# Patient Record
Sex: Male | Born: 2000 | Race: White | Hispanic: No | Marital: Single | Smoking: Never smoker
Health system: Southern US, Community
[De-identification: ages and names within clinical notes are randomized; demographics above are authoritative.]

## PROBLEM LIST (undated history)

## (undated) DIAGNOSIS — J45909 Unspecified asthma, uncomplicated: Secondary | ICD-10-CM

## (undated) HISTORY — PX: TONSILLECTOMY: SUR1361

## (undated) HISTORY — PX: ADENOIDECTOMY: SUR15

---
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
## 2023-11-28 ENCOUNTER — Ambulatory Visit: Payer: Self-pay | Admitting: General Surgery

## 2023-12-01 ENCOUNTER — Encounter (HOSPITAL_BASED_OUTPATIENT_CLINIC_OR_DEPARTMENT_OTHER): Payer: Self-pay | Admitting: General Surgery

## 2023-12-01 ENCOUNTER — Other Ambulatory Visit: Payer: Self-pay

## 2023-12-05 ENCOUNTER — Encounter (HOSPITAL_BASED_OUTPATIENT_CLINIC_OR_DEPARTMENT_OTHER)
Admission: RE | Disposition: A | Discharge status: Home / Self Care | Payer: Self-pay | Source: Home / Self Care | Attending: General Surgery

## 2023-12-05 ENCOUNTER — Encounter (HOSPITAL_BASED_OUTPATIENT_CLINIC_OR_DEPARTMENT_OTHER): Payer: Self-pay | Admitting: General Surgery

## 2023-12-05 ENCOUNTER — Ambulatory Visit (HOSPITAL_BASED_OUTPATIENT_CLINIC_OR_DEPARTMENT_OTHER)
Admission: RE | Admit: 2023-12-05 | Discharge: 2023-12-05 | Disposition: A | Discharge status: Home / Self Care | Payer: MEDICAID | Attending: General Surgery | Admitting: General Surgery

## 2023-12-05 DIAGNOSIS — J45909 Unspecified asthma, uncomplicated: Secondary | ICD-10-CM | POA: Diagnosis not present

## 2023-12-05 DIAGNOSIS — L989 Disorder of the skin and subcutaneous tissue, unspecified: Secondary | ICD-10-CM | POA: Diagnosis present

## 2023-12-05 DIAGNOSIS — L720 Epidermal cyst: Secondary | ICD-10-CM | POA: Insufficient documentation

## 2023-12-05 DIAGNOSIS — Z7951 Long term (current) use of inhaled steroids: Secondary | ICD-10-CM | POA: Insufficient documentation

## 2023-12-05 HISTORY — PX: EXCISION OF BACK LESION: SHX6597

## 2023-12-05 SURGERY — EXCISION, LESION, BACK
Anesthesia: LOCAL

## 2023-12-05 MED ORDER — CHLORHEXIDINE GLUCONATE CLOTH 2 % EX PADS: 6.0000 | MEDICATED_PAD | Freq: Once | CUTANEOUS | Status: DC

## 2023-12-05 MED ORDER — LIDOCAINE HCL 1 % IJ SOLN
INTRAMUSCULAR | Status: DC | PRN
  Administered 2023-12-05: 5 mL

## 2023-12-05 SURGICAL SUPPLY — 41 items
BENZOIN TINCTURE PRP APPL 2/3 (GAUZE/BANDAGES/DRESSINGS) ×1 IMPLANT
BLADE SURG 10 STRL SS (BLADE) ×1 IMPLANT
BLADE SURG 15 STRL LF DISP TIS (BLADE) ×1 IMPLANT
CANISTER SUCT 1200ML W/VALVE (MISCELLANEOUS) IMPLANT
CHLORAPREP W/TINT 26 (MISCELLANEOUS) ×1 IMPLANT
CLEANER CAUTERY TIP PAD (MISCELLANEOUS) ×1 IMPLANT
COVER BACK TABLE 60X90IN (DRAPES) ×1 IMPLANT
COVER MAYO STAND STRL (DRAPES) ×1 IMPLANT
DERMABOND ADVANCED .7 DNX12 (GAUZE/BANDAGES/DRESSINGS) IMPLANT
DRAPE LAPAROTOMY 100X72 PEDS (DRAPES) ×1 IMPLANT
DRAPE UTILITY XL STRL (DRAPES) ×1 IMPLANT
DRSG TEGADERM 4X4.75 (GAUZE/BANDAGES/DRESSINGS) IMPLANT
ELECTRODE REM PT RTRN 9FT ADLT (ELECTROSURGICAL) ×1 IMPLANT
GAUZE 4X4 16PLY ~~LOC~~+RFID DBL (SPONGE) IMPLANT
GAUZE SPONGE 2X2 STRL 8-PLY (GAUZE/BANDAGES/DRESSINGS) IMPLANT
GLOVE BIO SURGEON STRL SZ7.5 (GLOVE) ×1 IMPLANT
GOWN STRL REUS W/ TWL LRG LVL3 (GOWN DISPOSABLE) ×2 IMPLANT
NDL HYPO 25X1 1.5 SAFETY (NEEDLE) IMPLANT
NEEDLE HYPO 25X1 1.5 SAFETY (NEEDLE) IMPLANT
NS IRRIG 1000ML POUR BTL (IV SOLUTION) ×1 IMPLANT
PACK BASIN DAY SURGERY FS (CUSTOM PROCEDURE TRAY) ×1 IMPLANT
PENCIL SMOKE EVACUATOR (MISCELLANEOUS) ×1 IMPLANT
SPIKE FLUID TRANSFER (MISCELLANEOUS) IMPLANT
SPONGE T-LAP 18X18 ~~LOC~~+RFID (SPONGE) ×1 IMPLANT
STRIP CLOSURE SKIN 1/2X4 (GAUZE/BANDAGES/DRESSINGS) ×1 IMPLANT
STRIP CLOSURE SKIN 1/4X4 (GAUZE/BANDAGES/DRESSINGS) IMPLANT
SUT CHROMIC 3 0 SH 27 (SUTURE) IMPLANT
SUT ETHILON 3 0 PS 1 (SUTURE) IMPLANT
SUT MON AB 4-0 PC3 18 (SUTURE) IMPLANT
SUT PROLENE 3 0 PS 2 (SUTURE) IMPLANT
SUT SILK 2 0 PERMA HAND 18 BK (SUTURE) IMPLANT
SUT VIC AB 3-0 54X BRD REEL (SUTURE) IMPLANT
SUT VIC AB 3-0 FS2 27 (SUTURE) IMPLANT
SUT VIC AB 3-0 SH 27X BRD (SUTURE) IMPLANT
SUT VIC AB 4-0 PS2 18 (SUTURE) IMPLANT
SUT VIC AB 4-0 RB1 27X BRD (SUTURE) IMPLANT
SUT VICRYL AB 3 0 TIES (SUTURE) IMPLANT
SYR CONTROL 10ML LL (SYRINGE) IMPLANT
TOWEL GREEN STERILE FF (TOWEL DISPOSABLE) ×2 IMPLANT
TUBE CONNECTING 20X1/4 (TUBING) IMPLANT
YANKAUER SUCT BULB TIP NO VENT (SUCTIONS) IMPLANT

## 2023-12-05 NOTE — H&P (Signed)
 REFERRING PHYSICIAN: Alec House J PROVIDER: DEWARD GARNETTE NULL, MD MRN: [id number] DOB: 2000/07/15 Subjective   Chief Complaint: New Consultation  History of Present Illness: Anthony Mckenzie is a 23 y.o. male who is seen today as an office consultation for evaluation of New Consultation  We are asked to see the patient in consultation by Dr. Marcello Alec to evaluate him for a skin lesion on his back. The patient is a 23 year old white male who has noticed a lesion on his low back for the last couple years. It does cause him discomfort when he pushes his back up against something. He has had no drainage from the area. The size of the area has been stable over the last couple years. He does smoke marijuana and quit vaping about 2 weeks ago  Review of Systems: A complete review of systems was obtained from the patient. I have reviewed this information and discussed as appropriate with the patient. See HPI as well for other ROS.  ROS   Medical History: Past Medical History:  Diagnosis Date  Anxiety  Asthma, unspecified asthma severity, unspecified whether complicated, unspecified whether persistent ([facility]-HCC)   Patient Active Problem List  Diagnosis  Skin lesion of back   History reviewed. No pertinent surgical history.   No Known Allergies  Current Outpatient Medications on File Prior to Visit  Medication Sig Dispense Refill  ADZENYS XR-ODT 6.3 mg TbLB Take 1 tablet by mouth once daily  beclomethasone (QVAR) 40 mcg/actuation inhaler Inhale into the lungs  budesonide (PULMICORT) 0.5 mg/2 mL nebulizer solution Take 2 mLs (0.5 mg total) by nebulization once daily 60 mL 0  dexmethylphenidate (FOCALIN XR) 10 MG XR capsule Take by mouth  escitalopram oxalate (LEXAPRO) 10 MG tablet Take 10 mg by mouth once daily  ibuprofen (MOTRIN) 200 MG tablet Take by mouth  lisdexamfetamine (VYVANSE) 30 MG capsule Take by mouth nightly as needed  PROAIR HFA 90 mcg/actuation inhaler  valACYclovir (VALTREX)  500 MG tablet TAKE 1 CAPLET BY MOUTH TWICE DAILY   No current facility-administered medications on file prior to visit.   Family History  Problem Relation Age of Onset  High blood pressure (Hypertension) Mother    Social History   Tobacco Use  Smoking Status Never  Smokeless Tobacco Never    Social History   Socioeconomic History  Marital status: Single  Tobacco Use  Smoking status: Never  Smokeless tobacco: Never   Objective:   Vitals:  BP: 122/72  Weight: 79.2 kg (174 lb 9.6 oz)  Height: 185.4 cm (6' 1)  PainSc: 0-No pain   Body mass index is 23.04 kg/m.  Physical Exam Constitutional:  General: He is not in acute distress. Appearance: Normal appearance.  HENT:  Head: Normocephalic and atraumatic.  Right Ear: External ear normal.  Left Ear: External ear normal.  Nose: Nose normal.  Mouth/Throat:  Mouth: Mucous membranes are moist.  Pharynx: Oropharynx is clear.  Eyes:  General: No scleral icterus. Extraocular Movements: Extraocular movements intact.  Conjunctiva/sclera: Conjunctivae normal.  Pupils: Pupils are equal, round, and reactive to light.  Cardiovascular:  Rate and Rhythm: Normal rate and regular rhythm.  Pulses: Normal pulses.  Heart sounds: Normal heart sounds.  Pulmonary:  Effort: Pulmonary effort is normal. No respiratory distress.  Breath sounds: Normal breath sounds.  Abdominal:  General: Abdomen is flat. Bowel sounds are normal. There is no distension.  Palpations: Abdomen is soft.  Tenderness: There is no abdominal tenderness.  Musculoskeletal:  General: No swelling or deformity. Normal range  of motion.  Cervical back: Normal range of motion and neck supple. No tenderness.  Skin: General: Skin is warm and dry.  Coloration: Skin is not jaundiced.  Comments: There is a 6 mm raised lesion in the skin of the left low back. The area is mobile and does not appear to be tethered to anything deeper. There is no overlying skin changes and  no punctate openings in the skin  Neurological:  General: No focal deficit present.  Mental Status: He is alert and oriented to person, place, and time.  Psychiatric:  Mood and Affect: Mood normal.  Behavior: Behavior normal.     Labs, Imaging and Diagnostic Testing:  Assessment and Plan:   Diagnoses and all orders for this visit:  Skin lesion of back - [facility] Case Posting Request; Future   The patient appears to have a small lesion in the skin of his left low back which causes him some discomfort when it is pushed on. Because of this he would like to have it removed and I feel like this is a reasonable thing to do. I have discussed with him in detail the risks and benefits of the operation as well as some of the technical aspects and he understands and wishes to proceed. We will move forward surgical scheduling. I feel this could be done with just local anesthetic

## 2023-12-05 NOTE — Op Note (Signed)
 12/05/2023  1:21 PM  PATIENT:  Anthony Mckenzie  22 y.o. male  PRE-OPERATIVE DIAGNOSIS:  SKIN LESION BACK  POST-OPERATIVE DIAGNOSIS:  SKIN LESION BACK  PROCEDURE:  Procedure(s): EXCISION, LESION, BACK (N/A)  SURGEON:  Surgeons and Role:    * Curvin Deward MOULD, MD - Primary  PHYSICIAN ASSISTANT:   ASSISTANTS: none   ANESTHESIA:   local  EBL:  minimal   BLOOD ADMINISTERED:none  DRAINS: none   LOCAL MEDICATIONS USED:  MARCAINE    and LIDOCAINE    SPECIMEN:  Source of Specimen:  skin lesion from back  DISPOSITION OF SPECIMEN:  PATHOLOGY  COUNTS:  YES  TOURNIQUET:  * No tourniquets in log *  DICTATION: .Dragon Dictation  After informed consent was obtained the patient was brought to the operating room and placed in the prone position on the operating table.  The area on his back was then prepped with ChloraPrep, allowed to dry, and draped in usual sterile manner.  An appropriate timeout was performed.  The area around the skin lesion was infiltrated with a combination of 1% lidocaine  and quarter percent Marcaine until a good field block was created.  An elliptical incision was then made around the skin lesion with a 15 blade knife.  The incision was carried through the skin and subcutaneous tissue sharply with a 15 blade knife so that the entire skin lesion was removed.  Once it was removed it was oriented with a short stitch on the superior surface and a long stitch along the left surface.  The lesion was then sent to pathology for further evaluation.  The incision was then closed with multiple interrupted 4-0 Monocryl subcuticular stitches.  Dermabond dressings were applied.  The patient tolerated the procedure well.  At the end of the case all needle sponge and instrument counts were correct.  The patient was then taken to recovery in stable condition.  After excision the excised area measured approximately 2 x 1 cm  PLAN OF CARE: Discharge to home after PACU  PATIENT DISPOSITION:   PACU - hemodynamically stable.   Delay start of Pharmacological VTE agent (>24hrs) due to surgical blood loss or risk of bleeding: not applicable

## 2023-12-06 ENCOUNTER — Encounter (HOSPITAL_BASED_OUTPATIENT_CLINIC_OR_DEPARTMENT_OTHER): Payer: Self-pay | Admitting: General Surgery

## 2023-12-07 LAB — SURGICAL PATHOLOGY

## 2024-01-12 ENCOUNTER — Emergency Department (HOSPITAL_BASED_OUTPATIENT_CLINIC_OR_DEPARTMENT_OTHER)
Admission: EM | Admit: 2024-01-12 | Discharge: 2024-01-12 | Disposition: A | Discharge status: Home / Self Care | Payer: MEDICAID | Attending: Emergency Medicine | Admitting: Emergency Medicine

## 2024-01-12 ENCOUNTER — Emergency Department (HOSPITAL_BASED_OUTPATIENT_CLINIC_OR_DEPARTMENT_OTHER): Payer: MEDICAID

## 2024-01-12 ENCOUNTER — Encounter (HOSPITAL_BASED_OUTPATIENT_CLINIC_OR_DEPARTMENT_OTHER): Payer: Self-pay

## 2024-01-12 ENCOUNTER — Other Ambulatory Visit: Payer: Self-pay

## 2024-01-12 DIAGNOSIS — R519 Headache, unspecified: Secondary | ICD-10-CM | POA: Diagnosis present

## 2024-01-12 LAB — CBC WITH DIFFERENTIAL/PLATELET
Abs Immature Granulocytes: 0.02 K/uL (ref 0.00–0.07)
Basophils Absolute: 0 K/uL (ref 0.0–0.1)
Basophils Relative: 0 %
Eosinophils Absolute: 0 K/uL (ref 0.0–0.5)
Eosinophils Relative: 0 %
HCT: 42.1 % (ref 39.0–52.0)
Hemoglobin: 14.5 g/dL (ref 13.0–17.0)
Immature Granulocytes: 0 %
Lymphocytes Relative: 27 %
Lymphs Abs: 2 K/uL (ref 0.7–4.0)
MCH: 31.5 pg (ref 26.0–34.0)
MCHC: 34.4 g/dL (ref 30.0–36.0)
MCV: 91.5 fL (ref 80.0–100.0)
Monocytes Absolute: 0.6 K/uL (ref 0.1–1.0)
Monocytes Relative: 7 %
Neutro Abs: 5 K/uL (ref 1.7–7.7)
Neutrophils Relative %: 66 %
Platelets: 166 K/uL (ref 150–400)
RBC: 4.6 MIL/uL (ref 4.22–5.81)
RDW: 11.5 % (ref 11.5–15.5)
WBC: 7.6 K/uL (ref 4.0–10.5)
nRBC: 0 % (ref 0.0–0.2)

## 2024-01-12 LAB — BASIC METABOLIC PANEL WITH GFR
Anion gap: 9 (ref 5–15)
BUN: 12 mg/dL (ref 6–20)
CO2: 27 mmol/L (ref 22–32)
Calcium: 9.2 mg/dL (ref 8.9–10.3)
Chloride: 103 mmol/L (ref 98–111)
Creatinine, Ser: 1.31 mg/dL — ABNORMAL HIGH (ref 0.61–1.24)
GFR, Estimated: >60 mL/min (ref >60)
Glucose, Bld: 93 mg/dL (ref 70–99)
Potassium: 4.4 mmol/L (ref 3.5–5.1)
Sodium: 139 mmol/L (ref 135–145)

## 2024-01-12 MED ORDER — DIPHENHYDRAMINE HCL 50 MG/ML IJ SOLN
12.5000 mg | Freq: Once | INTRAMUSCULAR | Status: AC
  Administered 2024-01-12: 12.5 mg via INTRAVENOUS
  Filled 2024-01-12: qty 1

## 2024-01-12 MED ORDER — LORAZEPAM 2 MG/ML IJ SOLN
0.5000 mg | Freq: Once | INTRAMUSCULAR | Status: AC
  Administered 2024-01-12: 0.5 mg via INTRAVENOUS
  Filled 2024-01-12: qty 1

## 2024-01-12 MED ORDER — SODIUM CHLORIDE 0.9 % IV BOLUS
500.0000 mL | Freq: Once | INTRAVENOUS | Status: AC
  Administered 2024-01-12: 500 mL via INTRAVENOUS

## 2024-01-12 MED ORDER — PROCHLORPERAZINE EDISYLATE 10 MG/2ML IJ SOLN
10.0000 mg | Freq: Once | INTRAMUSCULAR | Status: AC
  Administered 2024-01-12: 10 mg via INTRAVENOUS
  Filled 2024-01-12: qty 2

## 2024-01-12 MED ORDER — DEXAMETHASONE SODIUM PHOSPHATE 10 MG/ML IJ SOLN
10.0000 mg | Freq: Once | INTRAMUSCULAR | Status: AC
  Administered 2024-01-12: 10 mg via INTRAVENOUS
  Filled 2024-01-12: qty 1

## 2024-01-12 NOTE — Discharge Instructions (Signed)
 Please return if you develop worsening headache fever as we discussed.

## 2024-01-12 NOTE — ED Triage Notes (Signed)
 Pt reports headache, eye pain & confusion X 4 days. Also endorses nausea & vomiting & photosensitivity. States that he stood up and hit head on a metal post on last Saturday.   Also states he had a sore throat on Thursday but has resolved.  No Hx of migraine

## 2024-01-12 NOTE — ED Notes (Signed)
 Pt alert and oriented X 4 at the time of discharge. RR even and unlabored. No acute distress noted. Pt verbalized understanding of discharge instructions as discussed. Pt ambulatory to lobby at time of discharge.

## 2024-01-12 NOTE — ED Provider Notes (Signed)
  EMERGENCY DEPARTMENT AT [facility] Provider Note   CSN: [id number] Arrival date & time: 01/12/24  1038     Patient presents with: Headache   Anthony Mckenzie is a 23 y.o. male.   Patient with headache nausea photosensitivity.  Symptoms seem to have started after hitting his head hard on a metal post about 5 days ago.  He has been having mild headache since then with some photophobia.  Symptoms worse to the last 3 days.  He has been taking some Aleve that has been helping.  He had sore throat viral symptoms a week ago that have also resolved started last Thursday and felt better by [facility], felt like he had coxsackie virus bc his brother recently had same.  Does not have any nasal congestion sore throat anymore.  No neck pain or neck stiffness.  No fever but maybe chills.  No weakness numbness tingling.  He has been foggy headed.  Mother states that his sister did have HSV meningitis as a child.  Patient otherwise states that he is feeling much better today.  Denies any loss of consciousness with the head injury.  He has had maybe a concussion as a child as well.  But no history of migraines. Mother thinks could be concussion but with viral symptoms last week worried for infectious process given hx with her daughter. Hasn't checked a temperature at home, has been taking advil/aleve pretty often for headache.  The history is provided by the patient and a caregiver.       Prior to Admission medications   Medication Sig Start Date End Date Taking? Authorizing Provider  albuterol (PROVENTIL HFA;VENTOLIN HFA) 108 (90 BASE) MCG/ACT inhaler Inhale 1-2 puffs into the lungs every 6 (six) hours as needed for wheezing or shortness of breath.    [provider]  beclomethasone (QVAR) 40 MCG/ACT inhaler Inhale 1 puff into the lungs 2 (two) times daily as needed (for shortness of breath).    [provider]    Allergies: Prochlorperazine     Review of  Systems  Updated Vital Signs BP 115/62 (BP Location: Right Arm)   Pulse 92   Temp 98.8 F (37.1 C) (Oral)   Resp 18   SpO2 100%   Physical Exam Vitals and nursing note reviewed.  Constitutional:      General: He is not in acute distress.    Appearance: He is well-developed. He is not ill-appearing.  HENT:     Head: Normocephalic and atraumatic.     Mouth/Throat:     Mouth: Mucous membranes are moist.     Pharynx: Oropharynx is clear.  Eyes:     Extraocular Movements: Extraocular movements intact.     Right eye: Normal extraocular motion and no nystagmus.     Left eye: Normal extraocular motion and no nystagmus.     Conjunctiva/sclera: Conjunctivae normal.     Pupils: Pupils are equal, round, and reactive to light.  Neck:     Meningeal: Brudzinski's sign and Kernig's sign absent.     Comments: Normal range of motion of the neck without any major discomfort no rigidity of the neck Cardiovascular:     Rate and Rhythm: Normal rate and regular rhythm.     Heart sounds: No murmur heard. Pulmonary:     Effort: Pulmonary effort is normal. No respiratory distress.     Breath sounds: Normal breath sounds.  Abdominal:     Palpations: Abdomen is soft.     Tenderness:  There is no abdominal tenderness.  Musculoskeletal:        General: No swelling.     Cervical back: Normal range of motion and neck supple. No rigidity.  Lymphadenopathy:     Cervical: No cervical adenopathy.  Skin:    General: Skin is warm and dry.     Capillary Refill: Capillary refill takes less than 2 seconds.  Neurological:     Mental Status: He is alert and oriented to person, place, and time.     Comments: 5+ out of 5 strength throughout, normal sensation, no drift, normal coordination, normal speech  Psychiatric:        Mood and Affect: Mood normal.     (all labs ordered are listed, but only abnormal results are displayed) Labs Reviewed  BASIC METABOLIC PANEL WITH GFR - Abnormal; Notable for the  following components:      Result Value   Creatinine, Ser 1.31 (*)    All other components within normal limits  CBC WITH DIFFERENTIAL/PLATELET    EKG: None  Radiology: CT Head Wo Contrast Result Date: 01/12/2024 EXAM: CT HEAD WITHOUT CONTRAST 01/12/2024 11:53:00 AM TECHNIQUE: CT of the head was performed without the administration of intravenous contrast. Automated exposure control, iterative reconstruction, and/or weight based adjustment of the mA/kV was utilized to reduce the radiation dose to as low as reasonably achievable. COMPARISON: CT head 09/08/2004 CLINICAL HISTORY: Polytrauma, blunt. Pt reports headache, eye pain \\T \ confusion X 4 days. Also endorses nausea \\T \ vomiting \\T \ photosensitivity. States that he stood up and hit head on a metal post on last Saturday. Also states he had a sore throat on Thursday but has resolved. FINDINGS: BRAIN AND VENTRICLES: No acute hemorrhage. No evidence of acute infarct. No hydrocephalus. No extra-axial collection. No mass effect or midline shift. ORBITS: No acute abnormality. SINUSES: No acute abnormality. SOFT TISSUES AND SKULL: No acute soft tissue abnormality. No skull fracture. IMPRESSION: 1. No acute intracranial abnormality. Electronically signed by: Donnice Mania MD 01/12/2024 12:09 PM EDT RP Workstation: [id number]     Procedures   Medications Ordered in the ED  prochlorperazine  (COMPAZINE ) injection 10 mg (10 mg Intravenous Given 01/12/24 1122)  diphenhydrAMINE  (BENADRYL ) injection 12.5 mg (12.5 mg Intravenous Given 01/12/24 1121)  diphenhydrAMINE  (BENADRYL ) injection 12.5 mg (12.5 mg Intravenous Given 01/12/24 1159)  dexamethasone  (DECADRON ) injection 10 mg (10 mg Intravenous Given 01/12/24 1200)  sodium chloride  0.9 % bolus 500 mL (0 mLs Intravenous Stopped 01/12/24 1335)  LORazepam  (ATIVAN ) injection 0.5 mg (0.5 mg Intravenous Given 01/12/24 1334)                                    Medical Decision Making Amount and/or Complexity of Data  Reviewed Labs: ordered. Radiology: ordered.  Risk Prescription drug management.   Anthony Mckenzie is here with headache photophobia.  Patient with unremarkable vitals.  No fever.  Overall he looks well.  He is ambulatory without any issues.  Somewhat complicated history with that patient had some sore throat mouth ulcers that started around Thursday last week 7 days ago.  That resolved about 4 days ago.  His brother had had hand-foot-and-mouth disease and he suspected and his mom suspects that he had the same.  He works in a daycare.  He started to develop a bad headache Sunday after he had hit his head on a metal post at a store on Saturday night.  He has  been having daily headaches since then and taking Advil/Aleve pretty often.  He has not noticed any fevers.  He is has some irritability.  Photophobia.  He is having pain to the top of his head.  Does not have any neck pain or neck stiffness.  He has been having some nausea and some vomiting.  No abdominal pain chest pain shortness of breath.  Neurologically he is intact.  He has no meningeal signs on exam.  I do suspect that this is likely concussion or migraine type process.  He has not endorsed any fever at home but they have not checked for fever he has also been taking Advil Aleve.  Ultimately we decided do CBC BMP and a head CT and give a headache cocktail with Compazine  and Benadryl  to see if we get some improvement of his symptoms.  He mostly was having some nausea feeling and he does not like taking Zofran so we elected to do Compazine .  However after we gave Compazine  patient developed dystonic reaction.  He had jitteriness he wanted to leave he wanted to walk around.  He was having burning in his legs.  But he did state that the headache actually improved.  Ultimately I gave him some Ativan  and Benadryl  that seemed to help improve his symptoms.  Lab work was ultimately unremarkable.  No leukocytosis anemia.  Creatinine was mildly elevated at 1.2  and he was given fluids for this.  His head CT was unremarkable.  Overall I had a very long discussion with both the patient and his mother and his father about pursuing a lumbar puncture to evaluate for a viral meningitis or aseptic meningitis.  The history does tend to lead more towards a migraine/concussion given the head trauma as well but he did have what sounds like a viral illness earlier last week and given some of the photophobia and the bad headache I thought it might be reasonable to try to pursue a lumbar puncture to try to get some more reassurance that this was not some type of meningitis.  Ultimately patient had anxiety about the lumbar puncture, family states that he does have some baseline anxiety especially about the healthcare system given that his sister has a lot of healthcare issues and the father recently went through cancer treatment.  Compazine  also has not helped him feel anxious free as well.  Overall we had a great discussion about pursuing the lumbar puncture but ultimately family and the patient, most of the patient prefer to hold on lumbar puncture at this time given that he is feeling better despite the Compazine  side effects.  Ultimately, plan was for watchful waiting at home if he developed worsening headache especially fever greater than 100.4 they would return for evaluation and likely lumbar puncture at that time.  Recommended maybe trying to avoid Tylenol Aleve and Advil Motrin to see if he does develop a fever as well.  Ultimately I thought it was reasonable to pursue a lumbar puncture but patient ultimately did not want to pursue it at this time and they understood the risks and benefits.  They were given very strict return precautions.  If he developed a fever had a severe worsening of his symptoms they should come back for evaluation and lumbar puncture and they were very comfortable with that decision.  Overall patient discharged.  This chart was dictated using voice  recognition software.  Despite best efforts to proofread,  errors can occur which can change the documentation meaning.  Final diagnoses:  Nonintractable headache, unspecified chronicity pattern, unspecified headache type    ED Discharge Orders     None          Ruthe Cornet, DO 01/12/24 1413

## 2024-01-12 NOTE — ED Notes (Signed)
 Water given to patient MD approved

## 2024-01-17 NOTE — Progress Notes (Unsigned)
 Subjective:   I, Anthony Collet, PhD, LAT, ATC acting as a scribe for Anthony Lloyd, MD.  Chief Complaint: Anthony Mckenzie,  is a 23 y.o. male who presents for initial evaluation of a head injury. Pt hit is his on a metal store front sign at [employer] at [employer]. He was see at the ED on 9/4 c/o HA, nausea, and photophobia.  Pt c/o cont'd difficulty sleeping, HA in the mornings,photophobia, a blurred vision. He is wondering when he can return to work out.   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   Dx testing: 01/12/24 Head CT  Injury date: 01/07/24 Visit #: 1  History of Present Illness:   Concussion Self-Reported Symptom Score Symptoms rated on a scale 1-6, in last 24 hours  Headache: 2   Pressure in head: 3 Neck pain: 1 Nausea or vomiting: 2 Dizziness: 1  Blurred vision: 3  Balance problems: 2 Sensitivity to light:  3 Sensitivity to noise: 3 Feeling slowed down: 3 Feeling like "in a fog": 2 Don't feel right": 3 Difficulty concentrating: 3 Difficulty remembering: 0 Fatigue or low energy: 3 Confusion: 2 Drowsiness: 3 More emotional: 4 Irritability: 3 Sadness: 0 Nervous or anxious: 3 Trouble falling asleep: 5   Total # of Symptoms: 20/22 Total Symptom Score: 54/132  Tinnitus: No  Review of Systems: No fever or chills.  Positive for insomnia and a little worse mood.  This was a bigger issue before he started exercising regularly.    Review of History: Otherwise healthy.  Patient is an avid exerciser doing weightlifting.  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  Objective:    Physical Examination Vitals:   01/18/24 1255  BP: 100/68  Pulse: 86  SpO2: 98%   MSK: Normal cervical motion  Neuro: Normal coordination and gait Psych: Normal speech thought process and affect.     Imaging:  CT Head Wo Contrast Result Date: 01/12/2024 EXAM: CT HEAD WITHOUT CONTRAST 01/12/2024 11:53:00 AM TECHNIQUE: CT of the head was  performed without the administration of intravenous contrast. Automated exposure control, iterative reconstruction, and/or weight based adjustment of the mA/kV was utilized to reduce the radiation dose to as low as reasonably achievable. COMPARISON: CT head 09/08/2004 CLINICAL HISTORY: Polytrauma, blunt. Pt reports headache, eye pain \\T \ confusion X 4 days. Also endorses nausea \\T \ vomiting \\T \ photosensitivity. States that he stood up and hit head on a metal post on last Saturday. Also states he had a sore throat on Thursday but has resolved. FINDINGS: BRAIN AND VENTRICLES: No acute hemorrhage. No evidence of acute infarct. No hydrocephalus. No extra-axial collection. No mass effect or midline shift. ORBITS: No acute abnormality. SINUSES: No acute abnormality. SOFT TISSUES AND SKULL: No acute soft tissue abnormality. No skull fracture. IMPRESSION: 1. No acute intracranial abnormality. Electronically signed by: Donnice Mania MD 01/12/2024 12:09 PM EDT RP Workstation: [id number]   LILLETTE Anthony Mckenzie, personally (independently) visualized and performed the interpretation of the images attached in this note.   Assessment and Plan   23 y.o. male with concussion occurring about a week and a half ago.  CT scan of the head was normal.  His symptoms are improving with the main issue being insomnia now.  He does note some bothersome nighttime headache.  Will use nortriptyline  at bedtime as this should help with insomnia and reduce headache.  If not effective or intolerable consider trazodone.  Okay to resume exercise but take  it easy and first and listen to his body.  Recheck in 3 weeks unless better in which case he can cancel.   Work note provided.    Action/Discussion: Reviewed diagnosis, management options, expected outcomes, and the reasons for scheduled and emergent follow-up. Questions were adequately answered. Patient expressed verbal understanding and agreement with the following plan.     Patient  Education: Reviewed with patient the risks (i.e, a repeat concussion, post-concussion syndrome, second-impact syndrome) of returning to play prior to complete resolution, and thoroughly reviewed the signs and symptoms of concussion.Reviewed need for complete resolution of all symptoms, with rest AND exertion, prior to return to play. Reviewed red flags for urgent medical evaluation: worsening symptoms, nausea/vomiting, intractable headache, musculoskeletal changes, focal neurological deficits. [facility] Concussion Care Plan, which clearly outlines the plans stated above, was given to patient.   Level of service: Total encounter time 30 minutes including face-to-face time with the patient and, reviewing past medical record, and charting on the date of service.        After Visit Summary printed out and provided to patient as appropriate.  The above documentation has been reviewed and is accurate and complete Anthony Mckenzie

## 2024-01-18 ENCOUNTER — Ambulatory Visit (INDEPENDENT_AMBULATORY_CARE_PROVIDER_SITE_OTHER): Payer: MEDICAID | Admitting: Family Medicine

## 2024-01-18 VITALS — BP 100/68 | HR 86 | Ht 73.0 in | Wt 172.0 lb

## 2024-01-18 DIAGNOSIS — S060X0A Concussion without loss of consciousness, initial encounter: Secondary | ICD-10-CM

## 2024-01-18 DIAGNOSIS — G4701 Insomnia due to medical condition: Secondary | ICD-10-CM

## 2024-01-18 MED ORDER — NORTRIPTYLINE HCL 25 MG PO CAPS: 25.0000 mg | ORAL_CAPSULE | Freq: Every day | ORAL | 2 refills | Status: AC

## 2024-01-18 NOTE — Patient Instructions (Addendum)
 Thank you for coming in today.   I've sent a prescription for Nortriptyline  to your pharmacy.   Check back in 3 weeks. OK to cancel if feeling better.

## 2024-01-19 ENCOUNTER — Encounter: Payer: MEDICAID | Admitting: Family Medicine

## 2024-02-08 ENCOUNTER — Encounter: Payer: MEDICAID | Admitting: Family Medicine
